# Patient Record
Sex: Female | Born: 1995 | Race: White | Hispanic: No | Marital: Single | Smoking: Never smoker
Health system: Southern US, Community
[De-identification: ages and names within clinical notes are randomized; demographics above are authoritative.]

## PROBLEM LIST (undated history)

## (undated) HISTORY — PX: OTHER SURGICAL HISTORY: SHX169

---

## 2018-05-13 ENCOUNTER — Emergency Department (HOSPITAL_BASED_OUTPATIENT_CLINIC_OR_DEPARTMENT_OTHER)
Admission: EM | Admit: 2018-05-13 | Discharge: 2018-05-13 | Disposition: A | Discharge status: Home / Self Care | Payer: Managed Care, Other (non HMO) | Attending: Emergency Medicine | Admitting: Emergency Medicine

## 2018-05-13 ENCOUNTER — Encounter (HOSPITAL_BASED_OUTPATIENT_CLINIC_OR_DEPARTMENT_OTHER): Payer: Self-pay | Admitting: Emergency Medicine

## 2018-05-13 ENCOUNTER — Other Ambulatory Visit: Payer: Self-pay

## 2018-05-13 ENCOUNTER — Emergency Department (HOSPITAL_BASED_OUTPATIENT_CLINIC_OR_DEPARTMENT_OTHER): Payer: Managed Care, Other (non HMO)

## 2018-05-13 DIAGNOSIS — W010XXA Fall on same level from slipping, tripping and stumbling without subsequent striking against object, initial encounter: Secondary | ICD-10-CM | POA: Insufficient documentation

## 2018-05-13 DIAGNOSIS — M546 Pain in thoracic spine: Secondary | ICD-10-CM | POA: Diagnosis present

## 2018-05-13 DIAGNOSIS — R55 Syncope and collapse: Secondary | ICD-10-CM | POA: Insufficient documentation

## 2018-05-13 DIAGNOSIS — Y9354 Activity, bowling: Secondary | ICD-10-CM | POA: Insufficient documentation

## 2018-05-13 DIAGNOSIS — M545 Low back pain: Secondary | ICD-10-CM | POA: Insufficient documentation

## 2018-05-13 DIAGNOSIS — W19XXXA Unspecified fall, initial encounter: Secondary | ICD-10-CM

## 2018-05-13 DIAGNOSIS — Z79899 Other long term (current) drug therapy: Secondary | ICD-10-CM | POA: Insufficient documentation

## 2018-05-13 LAB — PREGNANCY, URINE: Preg Test, Ur: NEGATIVE

## 2018-05-13 MED ORDER — ACETAMINOPHEN 500 MG PO TABS
1000.0000 mg | ORAL_TABLET | Freq: Once | ORAL | Status: AC
  Administered 2018-05-13: 1000 mg via ORAL
  Filled 2018-05-13: qty 2

## 2018-05-13 NOTE — ED Triage Notes (Signed)
Received pt via EMS with c/o fell [place] [employer] while bowling. Pt c/o low back pain denies + LOC. Pt was able to stand and pivot for EMS without difficulty.

## 2018-05-13 NOTE — ED Provider Notes (Signed)
[facility] EMERGENCY DEPARTMENT Provider Note   CSN: [id number] Arrival date & time: 05/13/18  1422     History   Chief Complaint Chief Complaint  Patient presents with  . Fall  . Back Pain    HPI Donna Stevenson is a 22 y.o. female without significant past medical hx who presents to the ED via EMS with complaints of back pain s/p mechanical fall just PTA. Patient reports that she was bowling when she slipped on the floor and fell backwards. No prodrome of dizziness/lightheadedness/chest pain/dyspnea. She is unsure if she hit her head, but thinks she may have, denies LOC, did feel that she may pass out once she hit the ground briefly but did not, this feeling is resolved. She is having pain most prominently to the lower back, however also is having a mild frontal headache. Rates her overall pain a 9/10 in severity, worse with attempts of movement, no alleviating factors. She did ambulate with EMS to stretcher short distance with discomfort. Has had some nausea without vomiting. She reports general weakness, no focal weakness. Denies change in vision, numbness, focal weakness, chest pain, or abdominal pain. Patient is currently breast feeding, denies chance of pregnancy.   HPI  History reviewed. No pertinent past medical history.  There are no active problems to display for this patient.   Past Surgical History:  Procedure Laterality Date  . tubes in ear       OB History   None      Home Medications    Prior to Admission medications   Medication Sig Start Date End Date Taking? Authorizing Provider  sertraline (ZOLOFT) 100 MG tablet Take 100 mg by mouth daily.   Yes [provider]    Family History No family history on file.  Social History Social History   Tobacco Use  . Smoking status: Never Smoker  . Smokeless tobacco: Never Used  Substance Use Topics  . Alcohol use: Never    Frequency: Never  . Drug use: Never     Allergies    Chocolate   Review of Systems Review of Systems  Cardiovascular: Negative for chest pain.  Gastrointestinal: Negative for abdominal pain.  Musculoskeletal: Positive for back pain.  Neurological: Positive for weakness (generalized, non focal) and headaches. Negative for dizziness, seizures, syncope, facial asymmetry, light-headedness and numbness.    Physical Exam Updated Vital Signs BP 125/80 (BP Location: Right Arm)   Pulse 84   Temp 98.5 F (36.9 C) (Oral)   Resp 16   Ht 5\' 2"  (1.575 m)   Wt 81.6 kg (180 lb)   LMP 04/22/2018 (Approximate)   SpO2 98%   BMI 32.92 kg/m   Physical Exam  Constitutional: She appears well-developed and well-nourished.  Non-toxic appearance. No distress.  HENT:  Head: Normocephalic and atraumatic. Head is without raccoon's eyes and without Battle's sign.  Right Ear: Tympanic membrane normal. No hemotympanum.  Left Ear: Tympanic membrane normal. No hemotympanum.  Nose: Nose normal.  Mouth/Throat: Uvula is midline and oropharynx is clear and moist.  Eyes: Pupils are equal, round, and reactive to light. Conjunctivae and EOM are normal. Right eye exhibits no discharge. Left eye exhibits no discharge.  Neck: Spinous process tenderness (diffuse, non focal) and muscular tenderness (bilateral) present.  C-spine collar in place on initial evaluation.  Cardiovascular: Normal rate and regular rhythm.  No murmur heard. Pulses:      Radial pulses are 2+ on the right side, and 2+ on the left side.  Posterior tibial pulses are 2+ on the right side, and 2+ on the left side.  Pulmonary/Chest: Breath sounds normal. No respiratory distress. She has no wheezes. She has no rhonchi. She has no rales.  Abdominal: Soft. She exhibits no distension. There is no tenderness.  Musculoskeletal:  No obvious deformity, appreciable swelling, erythema, ecchymosis, or open wounds. Upper extremities: Normal range of motion.  Nontender. Back: Patient is diffusely tender to  the entire back including cervical, thoracic, and lumbar midline and paraspinal muscles.  There is no point/focal vertebral tenderness.  There is no palpable crepitus or bony step-off.  No overlying abnormalities Lower extremities: Normal range of motion.  Nontender.  Neurological:  Alert.  Clear speech.  CN III through XII grossly intact.  Sensation grossly intact bilateral upper and lower extremities.  5 out of 5 symmetric grip strength.  5 out of 5 strength plantar dorsiflexion bilaterally.  Patient is able to ambulate in the emergency department slowly.  Skin: Skin is warm and dry. No rash noted.  Psychiatric: She has a normal mood and affect. Her behavior is normal.  Nursing note and vitals reviewed.  ED Treatments / Results  Labs (all labs ordered are listed, but only abnormal results are displayed) Labs Reviewed  PREGNANCY, URINE    EKG None  Radiology Dg Thoracic Spine W/swimmers  Result Date: 05/13/2018 CLINICAL DATA:  Fall today while bowling. Thoracic back pain. Initial encounter. EXAM: THORACIC SPINE - 3 VIEWS COMPARISON:  None. FINDINGS: There is no evidence of thoracic spine fracture. Alignment is normal. Intervertebral disc spaces are maintained. No other significant bone abnormalities are identified. IMPRESSION: Negative. Electronically Signed   By: Myles Rosenthal M.D.   On: 05/13/2018 17:11   Dg Lumbar Spine Complete  Result Date: 05/13/2018 CLINICAL DATA:  wtg on u-preg x 1 hr Fall today while bowling. Mid and low back pain. Slowed gait. EXAM: LUMBAR SPINE - COMPLETE 4+ VIEW COMPARISON:  None. FINDINGS: Normal alignment of lumbar vertebral bodies. No loss of vertebral body height or disc height. No pars fracture. No subluxation. IMPRESSION: No acute osseous abnormality. Electronically Signed   By: Genevive Bi M.D.   On: 05/13/2018 17:15   Ct Head Wo Contrast  Result Date: 05/13/2018 CLINICAL DATA:  Pain following fall EXAM: CT HEAD WITHOUT CONTRAST CT CERVICAL SPINE  WITHOUT CONTRAST TECHNIQUE: Multidetector CT imaging of the head and cervical spine was performed following the standard protocol without intravenous contrast. Multiplanar CT image reconstructions of the cervical spine were also generated. COMPARISON:  None. FINDINGS: CT HEAD FINDINGS Brain: Ventricles are normal in size and configuration. There is no intracranial mass, hemorrhage, extra-axial fluid collection, or midline shift. Gray-white compartments appear normal. No acute infarct. Vascular: No hyperdense vessel. There is no appreciable vascular calcification. Skull: Bony calvarium appears intact. Sinuses/Orbits: There is mucosal thickening in several ethmoid air cells. Other visualized paranasal sinuses are clear. Orbits appear symmetric bilaterally. Other: Mastoid air cells are clear. CT CERVICAL SPINE FINDINGS Alignment: There is no appreciable spondylolisthesis. Skull base and vertebrae: Skull base and craniocervical junction regions appear normal. No evident fracture. No blastic or lytic bone lesions. Soft tissues and spinal canal: Prevertebral soft tissues and predental space regions are normal. There is no paraspinous lesion. No evident cord or canal hematoma. Disc levels: Disc spaces appear normal. No nerve root edema or effacement. No disc extrusion or stenosis. Upper chest: Visualized upper lung regions are clear. Other: None IMPRESSION: CT head: Mild mucosal thickening in several ethmoid air cells. Study  otherwise. CT cervical spine: No fracture or spondylolisthesis. No evident arthropathy. Electronically Signed   By: Bretta Bang III M.D.   On: 05/13/2018 17:00   Ct Cervical Spine Wo Contrast  Result Date: 05/13/2018 CLINICAL DATA:  Pain following fall EXAM: CT HEAD WITHOUT CONTRAST CT CERVICAL SPINE WITHOUT CONTRAST TECHNIQUE: Multidetector CT imaging of the head and cervical spine was performed following the standard protocol without intravenous contrast. Multiplanar CT image  reconstructions of the cervical spine were also generated. COMPARISON:  None. FINDINGS: CT HEAD FINDINGS Brain: Ventricles are normal in size and configuration. There is no intracranial mass, hemorrhage, extra-axial fluid collection, or midline shift. Gray-white compartments appear normal. No acute infarct. Vascular: No hyperdense vessel. There is no appreciable vascular calcification. Skull: Bony calvarium appears intact. Sinuses/Orbits: There is mucosal thickening in several ethmoid air cells. Other visualized paranasal sinuses are clear. Orbits appear symmetric bilaterally. Other: Mastoid air cells are clear. CT CERVICAL SPINE FINDINGS Alignment: There is no appreciable spondylolisthesis. Skull base and vertebrae: Skull base and craniocervical junction regions appear normal. No evident fracture. No blastic or lytic bone lesions. Soft tissues and spinal canal: Prevertebral soft tissues and predental space regions are normal. There is no paraspinous lesion. No evident cord or canal hematoma. Disc levels: Disc spaces appear normal. No nerve root edema or effacement. No disc extrusion or stenosis. Upper chest: Visualized upper lung regions are clear. Other: None IMPRESSION: CT head: Mild mucosal thickening in several ethmoid air cells. Study otherwise. CT cervical spine: No fracture or spondylolisthesis. No evident arthropathy. Electronically Signed   By: Bretta Bang III M.D.   On: 05/13/2018 17:00    Procedures Procedures (including critical care time)  Medications Ordered in ED Medications  acetaminophen (TYLENOL) tablet 1,000 mg (1,000 mg Oral Given 05/13/18 1532)     Initial Impression / Assessment and Plan / ED Course  I have reviewed the triage vital signs and the nursing notes.  Pertinent labs & imaging results that were available during my care of the patient were reviewed by me and considered in my medical decision making (see chart for details).   Patient presents the ED status post  mechanical fall complaining of head and back pain.  Patient nontoxic-appearing, no apparent distress, vitals WNL.  On exam patient has diffuse tenderness to the cervical, thoracic, and lumbar regions.  CT head and neck as well as x-rays of the thoracic and lumbar spine were ordered.  Imaging is negative for acute intracranial abnormality, fractures, or dislocations.  No evidence of serious head, neck, or back injury.  No focal neurologic deficits.  Patient has been ambulatory in the emergency department without difficulty.  Suspect generalized soreness after the fall.  Patient is breast-feeding recommended Tylenol per over-the-counter dosing, she did have improvement in the ER after a dose here. I discussed results, treatment plan, need for PCP follow-up, and return precautions with the patient. Provided opportunity for questions, patient confirmed understanding and is in agreement with plan.    Final Clinical Impressions(s) / ED Diagnoses   Final diagnoses:  Fall, initial encounter    ED Discharge Orders    None       Desmond Lope 05/13/18 1916    Tegeler, Canary Brim, MD 05/14/18 [phone]

## 2018-05-13 NOTE — Discharge Instructions (Addendum)
Please read and follow all provided instructions. Tests performed today include: CT scan of your head and neck as well as x-rays of your mid and lower back did not show any fractures or dislocations.  Medications prescribed:    Take Tylenol per over-the-counter dosing instructions for any continued discomfort that you may have.  Home care instructions:  Follow any educational materials contained in this packet. The worst pain and soreness will be 24-48 hours after the accident. Your symptoms should resolve steadily over several days at this time. Use warmth on affected areas as needed.   Follow-up instructions: Please follow-up with your primary care provider in 1 week for further evaluation of your symptoms if they are not completely improved.   Return instructions:  Please return to the Emergency Department if you experience worsening symptoms.  You have numbness, tingling, or weakness in the arms or legs.  You develop severe headaches not relieved with medicine.  You have severe neck pain, especially tenderness in the middle of the back of your neck.  You have vision or hearing changes If you develop confusion You have changes in bowel or bladder control.  There is increasing pain in any area of the body.  You have shortness of breath, lightheadedness, dizziness, or fainting.  You have chest pain.  You feel sick to your stomach (nauseous), or throw up (vomit).  You have increasing abdominal discomfort.  There is blood in your urine, stool, or vomit.  You have pain in your shoulder (shoulder strap areas).  You feel your symptoms are getting worse or if you have any other emergent concerns  Additional Information:  Your vital signs today were: Vitals:   05/13/18 1436  BP: 125/80  Pulse: 84  Resp: 16  Temp: 98.5 F (36.9 C)  SpO2: 98%     If your blood pressure (BP) was elevated above 135/85 this visit, please have this repeated by your doctor within one  month -----------------------------------------------------

## 2019-12-22 IMAGING — DX DG LUMBAR SPINE COMPLETE 4+V
5 series · 5 of 5 positions shown · non-contrast
Comparison: None.

CLINICAL DATA: wtg on u-preg x 1 hr Fall today while bowling. Mid
and low back pain. Slowed gait.

EXAM:
LUMBAR SPINE - COMPLETE 4+ VIEW

[l-spine ap]
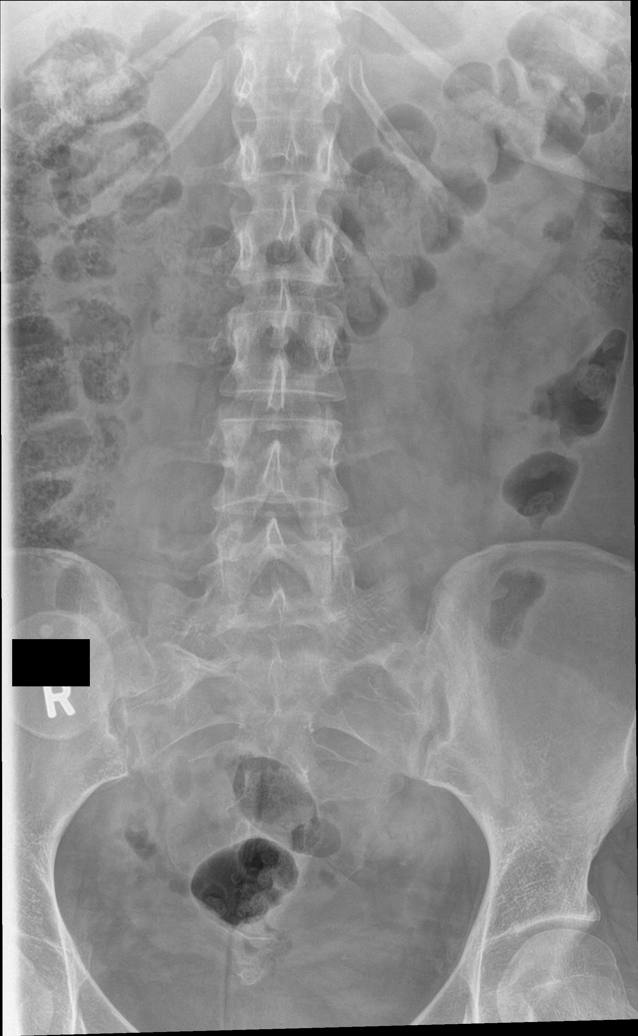

[l-spine obl (1 of 2)]
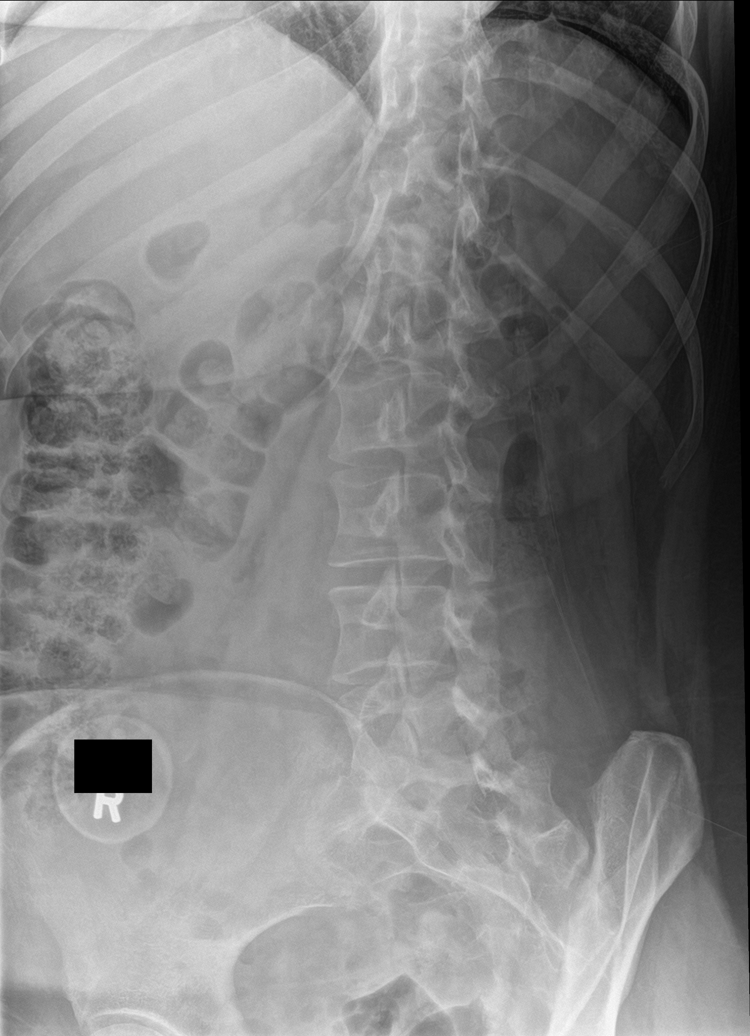

[l-spine obl (2 of 2)]
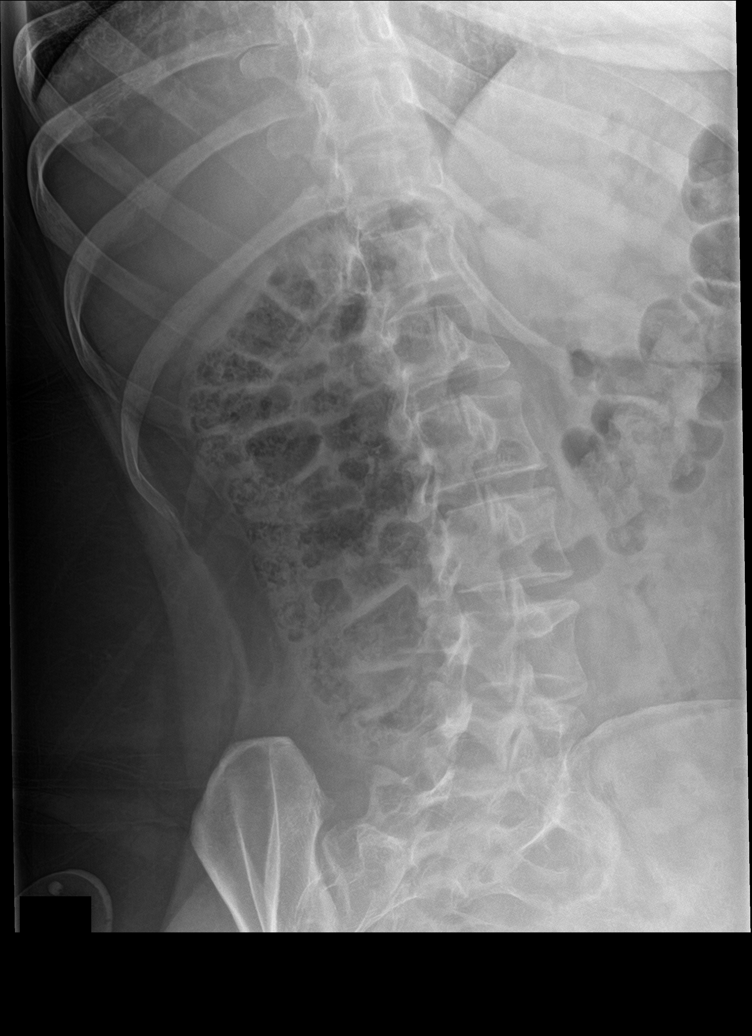

[l-spine lat]
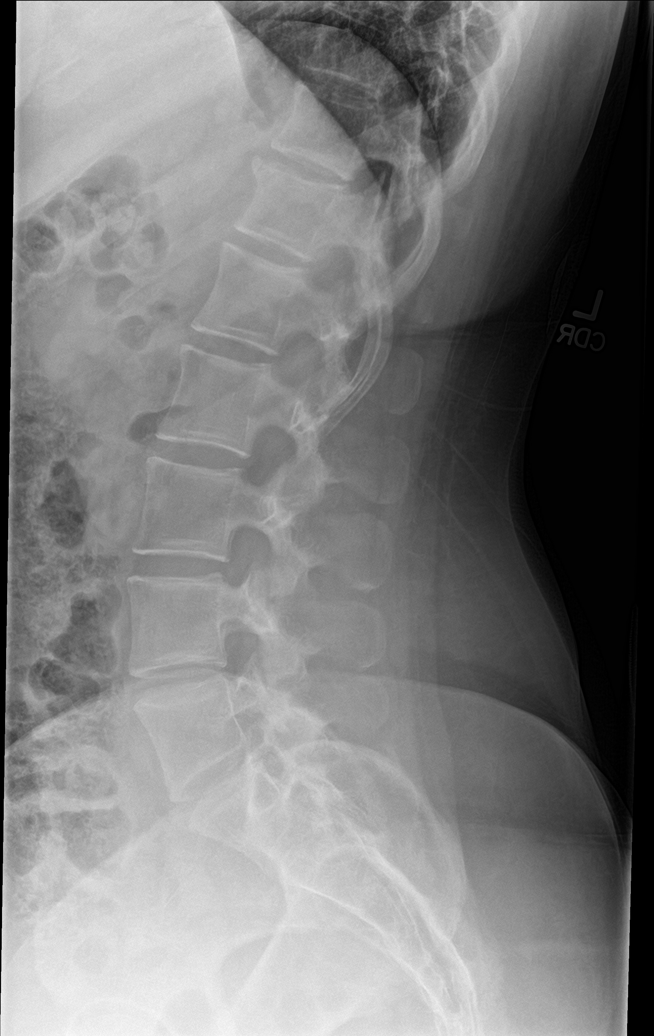

[l-spine spot]
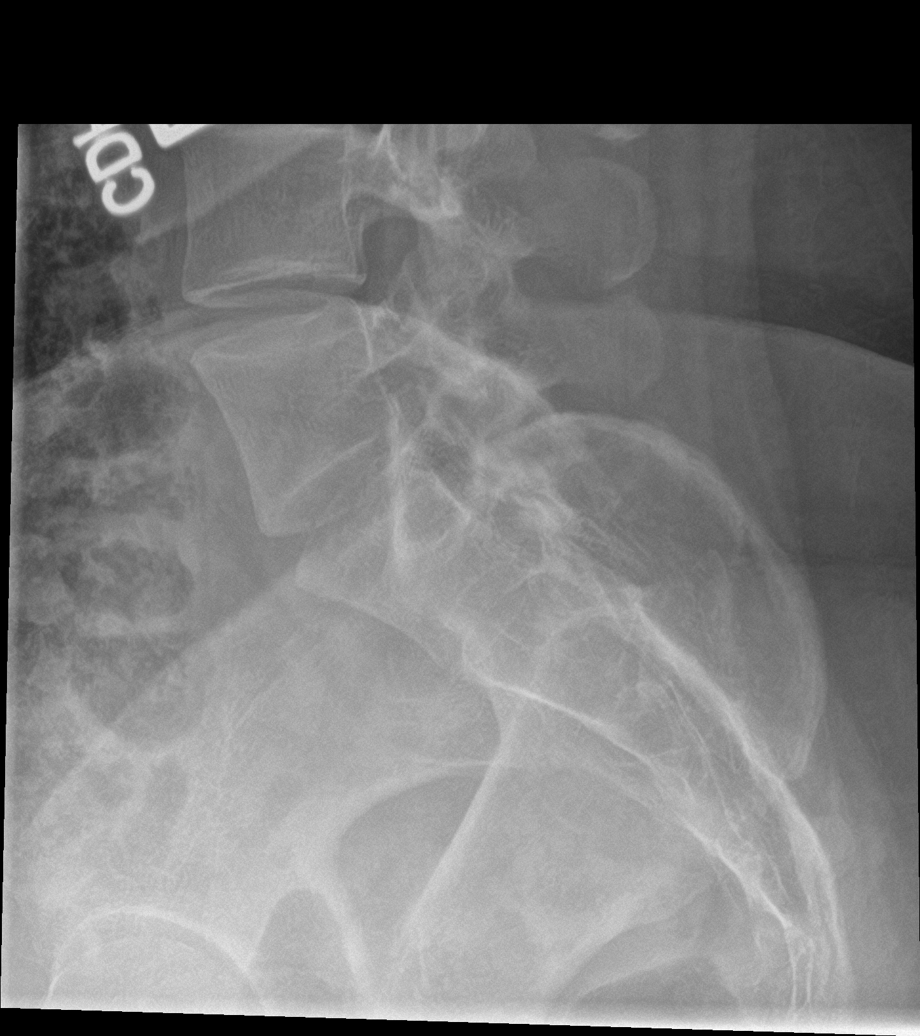

[5 of 5 positions shown; findings below may reference images not displayed]

FINDINGS: Normal alignment of lumbar vertebral bodies. No loss of vertebral
body height or disc height. No pars fracture. No subluxation.
IMPRESSION: No acute osseous abnormality.
